# Patient Record
Sex: Female | Born: 1955 | Race: White | Hispanic: No | Marital: Married | Smoking: Never smoker
Health system: Southern US, Community
[De-identification: ages and names within clinical notes are randomized; demographics above are authoritative.]

## PROBLEM LIST (undated history)

## (undated) ENCOUNTER — Ambulatory Visit (HOSPITAL_COMMUNITY): Admission: EM | Payer: PRIVATE HEALTH INSURANCE | Source: Home / Self Care

## (undated) DIAGNOSIS — C50919 Malignant neoplasm of unspecified site of unspecified female breast: Secondary | ICD-10-CM

## (undated) DIAGNOSIS — F0390 Unspecified dementia without behavioral disturbance: Secondary | ICD-10-CM

## (undated) DIAGNOSIS — C801 Malignant (primary) neoplasm, unspecified: Secondary | ICD-10-CM

---

## 2011-03-21 ENCOUNTER — Other Ambulatory Visit: Payer: Self-pay | Admitting: Unknown Physician Specialty

## 2011-03-21 DIAGNOSIS — Z1231 Encounter for screening mammogram for malignant neoplasm of breast: Secondary | ICD-10-CM

## 2011-03-21 DIAGNOSIS — Z853 Personal history of malignant neoplasm of breast: Secondary | ICD-10-CM

## 2011-04-07 ENCOUNTER — Ambulatory Visit
Admission: RE | Admit: 2011-04-07 | Discharge: 2011-04-07 | Disposition: A | Discharge status: Home / Self Care | Payer: PRIVATE HEALTH INSURANCE | Source: Ambulatory Visit | Attending: Unknown Physician Specialty | Admitting: Unknown Physician Specialty

## 2011-04-07 DIAGNOSIS — Z853 Personal history of malignant neoplasm of breast: Secondary | ICD-10-CM

## 2011-04-07 DIAGNOSIS — Z1231 Encounter for screening mammogram for malignant neoplasm of breast: Secondary | ICD-10-CM

## 2012-03-15 ENCOUNTER — Other Ambulatory Visit: Payer: Self-pay | Admitting: Unknown Physician Specialty

## 2012-03-15 DIAGNOSIS — Z1231 Encounter for screening mammogram for malignant neoplasm of breast: Secondary | ICD-10-CM

## 2012-04-12 ENCOUNTER — Ambulatory Visit
Admission: RE | Admit: 2012-04-12 | Discharge: 2012-04-12 | Disposition: A | Discharge status: Home / Self Care | Payer: PRIVATE HEALTH INSURANCE | Source: Ambulatory Visit | Attending: Unknown Physician Specialty | Admitting: Unknown Physician Specialty

## 2012-04-12 DIAGNOSIS — Z1231 Encounter for screening mammogram for malignant neoplasm of breast: Secondary | ICD-10-CM

## 2013-03-07 ENCOUNTER — Other Ambulatory Visit: Payer: Self-pay

## 2013-03-07 DIAGNOSIS — Z1231 Encounter for screening mammogram for malignant neoplasm of breast: Secondary | ICD-10-CM

## 2013-04-18 ENCOUNTER — Ambulatory Visit
Admission: RE | Admit: 2013-04-18 | Discharge: 2013-04-18 | Disposition: A | Discharge status: Home / Self Care | Payer: PRIVATE HEALTH INSURANCE | Source: Ambulatory Visit

## 2013-04-18 DIAGNOSIS — Z1231 Encounter for screening mammogram for malignant neoplasm of breast: Secondary | ICD-10-CM

## 2014-04-04 ENCOUNTER — Other Ambulatory Visit: Payer: Self-pay

## 2014-04-04 DIAGNOSIS — Z1231 Encounter for screening mammogram for malignant neoplasm of breast: Secondary | ICD-10-CM

## 2014-04-04 DIAGNOSIS — Z853 Personal history of malignant neoplasm of breast: Secondary | ICD-10-CM

## 2014-04-26 ENCOUNTER — Ambulatory Visit
Admission: RE | Admit: 2014-04-26 | Discharge: 2014-04-26 | Disposition: A | Discharge status: Home / Self Care | Payer: PRIVATE HEALTH INSURANCE | Source: Ambulatory Visit

## 2014-04-26 DIAGNOSIS — Z853 Personal history of malignant neoplasm of breast: Secondary | ICD-10-CM

## 2014-04-26 DIAGNOSIS — Z1231 Encounter for screening mammogram for malignant neoplasm of breast: Secondary | ICD-10-CM

## 2015-03-22 ENCOUNTER — Other Ambulatory Visit: Payer: Self-pay

## 2015-03-22 DIAGNOSIS — Z1231 Encounter for screening mammogram for malignant neoplasm of breast: Secondary | ICD-10-CM

## 2015-04-30 ENCOUNTER — Ambulatory Visit: Payer: PRIVATE HEALTH INSURANCE

## 2015-06-29 ENCOUNTER — Ambulatory Visit
Admission: RE | Admit: 2015-06-29 | Discharge: 2015-06-29 | Disposition: A | Discharge status: Home / Self Care | Payer: PRIVATE HEALTH INSURANCE | Source: Ambulatory Visit

## 2015-06-29 DIAGNOSIS — Z1231 Encounter for screening mammogram for malignant neoplasm of breast: Secondary | ICD-10-CM

## 2016-01-11 ENCOUNTER — Ambulatory Visit: Payer: PRIVATE HEALTH INSURANCE | Attending: Unknown Physician Specialty

## 2016-01-11 DIAGNOSIS — R41841 Cognitive communication deficit: Secondary | ICD-10-CM

## 2016-01-11 NOTE — Patient Instructions (Signed)
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  Practice answering her iPhone, 20-25 reps, a couple to few times per day.

## 2016-01-14 NOTE — Therapy (Signed)
[facility] [address] [place], [place], [address] Phone: [phone]   Fax:  [phone]  Speech Language Pathology Evaluation  Patient Details  Name: Danielle Snyder MRN: [id number] Date of Birth: [birth year] Referring Provider: Guy Begin M.D.  Encounter Date: 01/11/2016    No past medical history on file.  No past surgical history on file.  There were no vitals filed for this visit.          SLP Evaluation [facility] - 01/14/16 1223      SLP Visit Information   SLP Received On 01/11/16   Referring Provider Guy Begin M.D.   Onset Date Late 2016   Medical Diagnosis Frontotemporal Dementia     Pain Assessment   Currently in Pain? 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   Following Commands Follows multi-step commands inconsistently;Follows one step commands inconsistently   Safety and Judgement Comments Pt reportedly has forgotten how to put her car into gear, putting away dirty dishes.   Awareness Emergent   Awareness Impaired   Awareness Impairment Emergent impairment   Problem Solving Impaired   Problem Solving Impairment Verbal basic;Functional basic   Executive Function Reasoning;Organizing;Initiating;Self Monitoring;Self Correcting     Auditory Comprehension   Overall Auditory Comprehension Appears within functional limits for tasks assessed  extra  time req'd occasionally     Expression   Primary Mode of Expression Verbal     Verbal Expression   Overall Verbal Expression Impaired  with extra time needed at times   Naming Impairment   Divergent --  two words beginning with "s" - due to decr'd cognition     Standardized Assessments   Standardized Assessments  Montreal Cognitive Assessment (MOCA)   Montreal Cognitive Assessment (MOCA)  15/30 (scribble marks for all written tasks), false positives and decr'd awareness/attention with letter ID in string. Pt's emergent awareness, attention, organization, memory skills appear deficient.                              SLP Long Term Goals - 01/14/16 1228      SLP LONG TERM GOAL #1   Title pt will demo knowledge of how to access flowsheets/directions for household tasks   Time 4   Period Weeks  or 8 visits   Status New     SLP LONG TERM GOAL #2   Title pt/husband will demo knowledge of how to develop flowsheets/directions for simple household tasks   Time 4   Period Weeks  or 8 visits   Status New        Patient will benefit from skilled therapeutic intervention in order to improve the following deficits and impairments:   Cognitive communication deficit    Problem List There are no active problems to display for this patient.   [facility] ,[place], [place]  01/14/2016, 12:37 PM  [facility] [address] [place], [place],  [id number] Phone: [phone]   Fax:  [phone]  Name: Danielle Snyder MRN: [id number] Date of Birth: 06/12/1956

## 2016-01-16 ENCOUNTER — Ambulatory Visit: Payer: PRIVATE HEALTH INSURANCE | Attending: Unknown Physician Specialty

## 2016-01-16 DIAGNOSIS — R41841 Cognitive communication deficit: Secondary | ICD-10-CM | POA: Diagnosis not present

## 2016-01-16 NOTE — Patient Instructions (Signed)
Let me know how the coffee making goes next session, as well as the dryer and the simple meal preparation, using written directions. We can continue to problem solve next time, but you have a very good idea of how to do this at home now.

## 2016-01-16 NOTE — Therapy (Signed)
[facility] [address] [place], [place], [address] Phone: [phone]   Fax:  [phone]  Speech Language Pathology Treatment  Patient Details  Name: Danielle Snyder MRN: [id number] Date of Birth: [birth year] Referring Provider: Guy Begin M.D.  Encounter Date: 01/16/2016      End of Session - 01/16/16 1629    Visit Number 2   Number of Visits 17   Date for SLP Re-Evaluation 04/12/16   Authorization - Visit Number 5   Authorization - Number of Visits 30   SLP Start Time [id number]   SLP Stop Time  [id number]   SLP Time Calculation (min) 43 min   Activity Tolerance Patient tolerated treatment well      No past medical history on file.  No past surgical history on file.  There were no vitals filed for this visit.             ADULT SLP TREATMENT - 01/16/16 1437      General Information   Behavior/Cognition Confused;Cooperative;Pleasant mood;Distractible;Requires cueing     Treatment Provided   Treatment provided Cognitive-Linquistic     Pain Assessment   Pain Assessment No/denies pain     Cognitive-Linquistic Treatment   Treatment focused on Cognition;Aphasia   Skilled Treatment SLP assisted pt and husband with organizing written instructions for pt to operate coffee maker, and provided problem solving and consulting re: how it might work at home, given pt's deficits in language, receptively and expressively. SLP facilitated practice with answering pt's iPhone as well, however pt independent with this today in session! Pt's husband thanked SLP three times for his assistance with these tasks, and inquired to SLP re: two other tasks (using dryer and meal prep) how to make these easier/possible for pt.     Assessment / Recommendations / Plan   Plan Continue with current plan of care  one more visit likely until d/c     Progression Toward Goals   Progression toward goals Progressing toward goals          SLP Education - 01/16/16 1629    Education Details how to "scaffold" household tasks (simple)   Person(s) Educated Patient;Spouse   Methods Explanation;Demonstration   Comprehension Verbalized understanding  (husband)            SLP Long Term Goals - 01/16/16 1632      SLP LONG TERM GOAL #1   Title pt will demo knowledge of how to access flowsheets/directions for household tasks   Time 4   Period Weeks  or 8 visits   Status New     SLP LONG TERM GOAL #2   Title pt/husband will demo knowledge of how to develop flowsheets/directions for simple household tasks   Period --  or 8 visits   Status Achieved          Plan - 01/16/16 1630    Clinical Impression Statement Pt's husband appears to have a good idea about how to simplify simple household tasks, providing written scaffolding for pt in order to cue her how to complete task. They will arrive next session with questions about how best to proceed with things theywill try these next two weeks. One-two more sessions necessary to maximize SLP A to pt to enhance her understanding of how to complete simple household tasks.   Speech Therapy Frequency 2x / week   Duration 4 weeks  or 8 visits   Treatment/Interventions Compensatory strategies;Patient/family education;Multimodal communcation approach;Internal/external aids;Language facilitation;SLP instruction and  feedback   Potential to Achieve Goals Fair   Potential Considerations Medical prognosis;Severity of impairments;Ability to learn/carryover information      Patient will benefit from skilled therapeutic intervention in order to improve the following deficits and impairments:   Cognitive communication deficit    Problem List There are no active problems to display for this patient.   [facility] ,[place], [place]  01/16/2016, 4:32 PM  [facility] [address] [place], [place], [address] Phone: [phone]    Fax:  [phone]   Name: Danielle Snyder MRN: [id number] Date of Birth: June 27, 1955

## 2016-01-28 ENCOUNTER — Ambulatory Visit: Payer: PRIVATE HEALTH INSURANCE

## 2016-02-19 ENCOUNTER — Ambulatory Visit: Payer: PRIVATE HEALTH INSURANCE

## 2016-02-20 NOTE — Therapy (Signed)
[facility] [address] [place], [place], [address] Phone: [phone]   Fax:  [phone]  Patient Details  Name: Danielle Snyder MRN: [id number] Date of Birth: Jan 04, 1956 Referring Provider:  No ref. provider found  Encounter Date: 02/20/2016  SPEECH THERAPY DISCHARGE SUMMARY  Visits from Start of Care: 2  Current functional level related to goals / functional outcomes: Pt only had one session of ST prior to request for putting on hold due to second opinion. In that one session, session note and goal summary including plan/clinical impression statement are below:  SLP assisted pt and husband with organizing written instructions for pt to operate coffee maker, and provided problem solving and consulting re: how it might work at home, given pt's deficits in language, receptively and expressively. SLP facilitated practice with answering pt's iPhone as well, however pt independent with this today in session! Pt's husband thanked SLP three times for his assistance with these tasks, and inquired to SLP re: two other tasks (using dryer and meal prep) how to make these easier/possible for pt.   SLP Long Term Goals - 01/16/16 1632              SLP LONG TERM GOAL #1    Title pt will demo knowledge of how to access flowsheets/directions for household tasks    Time 4    Period Weeks  or 8 visits    Status New         SLP LONG TERM GOAL #2    Title pt/husband will demo knowledge of how to develop flowsheets/directions for simple household tasks    Period --  or 8 visits    Status Achieved                       Plan - 01/16/16 1630     Clinical Impression Statement Pt's husband appears to have a good idea about how to simplify simple household tasks, providing written scaffolding for pt in order to cue her how to complete task. They will arrive next session with questions about how best to proceed with things theywill try these  next two weeks. One-two more sessions necessary to maximize SLP A to pt to enhance her understanding of how to complete simple household tasks.      Remaining deficits: Significant cognitive-linguistic deficits stemming from what appears most like FTD.   Education / Equipment: Compensations for [employer] and cognitive deficits.  Plan: Patient agrees to discharge.  Patient goals were not met. Patient is being discharged due to                                                     ?????pt/family request         Garald Balding ,[place], [place]  02/20/2016, 11:14 AM  [facility] [address] [place], [place], [address] Phone: [phone]   Fax:  [phone]

## 2016-02-21 ENCOUNTER — Ambulatory Visit: Payer: PRIVATE HEALTH INSURANCE

## 2016-11-29 ENCOUNTER — Encounter (HOSPITAL_COMMUNITY): Payer: Self-pay | Admitting: Emergency Medicine

## 2016-11-29 ENCOUNTER — Emergency Department (HOSPITAL_COMMUNITY): Payer: PRIVATE HEALTH INSURANCE

## 2016-11-29 ENCOUNTER — Emergency Department (HOSPITAL_COMMUNITY)
Admission: EM | Admit: 2016-11-29 | Discharge: 2016-11-29 | Disposition: A | Discharge status: Home / Self Care | Payer: PRIVATE HEALTH INSURANCE | Attending: Emergency Medicine | Admitting: Emergency Medicine

## 2016-11-29 DIAGNOSIS — Y939 Activity, unspecified: Secondary | ICD-10-CM | POA: Insufficient documentation

## 2016-11-29 DIAGNOSIS — F0390 Unspecified dementia without behavioral disturbance: Secondary | ICD-10-CM | POA: Diagnosis not present

## 2016-11-29 DIAGNOSIS — Y92015 Private garage of single-family (private) house as the place of occurrence of the external cause: Secondary | ICD-10-CM | POA: Insufficient documentation

## 2016-11-29 DIAGNOSIS — Z79899 Other long term (current) drug therapy: Secondary | ICD-10-CM | POA: Insufficient documentation

## 2016-11-29 DIAGNOSIS — S0993XA Unspecified injury of face, initial encounter: Secondary | ICD-10-CM | POA: Diagnosis present

## 2016-11-29 DIAGNOSIS — W19XXXA Unspecified fall, initial encounter: Secondary | ICD-10-CM | POA: Insufficient documentation

## 2016-11-29 DIAGNOSIS — Y998 Other external cause status: Secondary | ICD-10-CM | POA: Insufficient documentation

## 2016-11-29 HISTORY — DX: Unspecified dementia, unspecified severity, without behavioral disturbance, psychotic disturbance, mood disturbance, and anxiety: F03.90

## 2016-11-29 NOTE — ED Provider Notes (Signed)
[facility] DEPT Provider Note   CSN: [id number] Arrival date & time: 11/29/16  1104     History   Chief Complaint Chief Complaint  Patient presents with  . Fall  . Facial Pain    HPI Tomeko Scoville is a 61 y.o. female.  The history is provided by the patient and medical records.  Fall     Level V caveat: Dementia 61 year old female with history of dementia brought in by her husband after a fall last evening. Husband reports patient tends to get up and pace during the night. States there is a time on all of their doors so he did hear the door to the garage open and then heard a loud noise.  States he did not hear her moving after nurse that he got up to check on her and found her face down on the garage floor. She was awake and moving but was "out of it".  He states she laid on the floor for Mabel was able to get up and walk back to her room. States she thinks it often turn during the night and did not sleep very much in his opinion. States this morning when he got up he noticed a lot of bruising around her left eye along left side of her jaw. Husband reports their daughter is a physician in [place] so they face timed this morning and daughter recommended to come in for head CT.  Husband reports she does seem mildly "sluggish" compared to her normal, he thinks this is because she did not sleep very much. She is not currently on anticoagulation. She has no history of significant head injuries in the past.  Past Medical History:  Diagnosis Date  . Dementia     There are no active problems to display for this patient.   History reviewed. No pertinent surgical history.  OB History    No data available       Home Medications    Prior to Admission medications   Medication Sig Start Date End Date Taking? Authorizing Provider  sertraline (ZOLOFT) 50 MG tablet Take 25-50 mg by mouth See admin instructions. Take 50 mg by mouth in the morning and take 25 mg by mouth in the evening  11/04/16  Yes [provider]    Family History No family history on file.  Social History Social History  Substance Use Topics  . Smoking status: Not on file  . Smokeless tobacco: Not on file  . Alcohol use Not on file     Allergies   Sulfa antibiotics and Clarithromycin   Review of Systems Review of Systems  Unable to perform ROS: Dementia     Physical Exam Updated Vital Signs BP 108/85 (BP Location: Right Arm)   Pulse 67   Temp 98.3 F (36.8 C) (Oral)   Resp 18   Ht 5\' 7"  (1.702 m)   Wt 63.5 kg (140 lb)   SpO2 98%   BMI 21.93 kg/m   Physical Exam  Constitutional: She appears well-developed and well-nourished.  HENT:  Head: Normocephalic and atraumatic.  Mouth/Throat: Oropharynx is clear and moist.  Bruising beneath left eye as well as along the left side of the jaw, I do not appreciate any bony deformities or crepitus, patient unwilling to open mouth widely; no open wounds or lacerations  Eyes: Conjunctivae and EOM are normal. Pupils are equal, round, and reactive to light.  Neck: Normal range of motion.  Cardiovascular: Normal rate, regular rhythm and normal heart  sounds.   Pulmonary/Chest: Effort normal and breath sounds normal. No respiratory distress. She has no wheezes.  Abdominal: Soft. Bowel sounds are normal. There is no tenderness. There is no rebound.  Musculoskeletal: Normal range of motion.  There is some mild superficial bruising and swelling of the left knee, worse along the lateral joint line, there is no crepitus or bony deformity, no apparent pain with range of motion  Neurological: She is alert.  Awake, alert, follows some commands when prompted, does not provide any verbal responses for questioning, spontaneously moving her arms and legs without apparent issue  Skin: Skin is warm and dry.  Psychiatric: She has a normal mood and affect.  Nursing note and vitals reviewed.    ED Treatments / Results  Labs (all labs ordered are  listed, but only abnormal results are displayed) Labs Reviewed - No data to display  EKG  EKG Interpretation None       Radiology Ct Head Wo Contrast  Result Date: 11/29/2016 CLINICAL DATA:  Dementia.  Recent fall.  Left face swelling. EXAM: CT HEAD WITHOUT CONTRAST CT MAXILLOFACIAL WITHOUT CONTRAST CT CERVICAL SPINE WITHOUT CONTRAST TECHNIQUE: Multidetector CT imaging of the head, cervical spine, and maxillofacial structures were performed using the standard protocol without intravenous contrast. Multiplanar CT image reconstructions of the cervical spine and maxillofacial structures were also generated. 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 Finding is unchanged. Low-density in periventricular white matter appears stable. Negative for acute hemorrhage, mass lesion, midline shift, hydrocephalus or large infarct. Mild cerebral atrophy is unchanged. Vascular: No hyperdense vessel or unexpected calcification. Skull: Normal. Negative for fracture or focal lesion. Other: None. CT MAXILLOFACIAL FINDINGS Osseous: No fracture or mandibular dislocation. No destructive process. Orbits: Negative. No traumatic or inflammatory finding. Sinuses: Small polyp or retention cysts in the right maxillary sinus. Small amount of fluid or mucosal disease in the left sphenoid sinus. Mild disease in left ethmoid air cells. Mastoid air cells are clear. Normal appearance of the middle ear ossicles. Soft tissues: Soft tissue swelling in the left cheek and left side of the face. CT CERVICAL SPINE FINDINGS Alignment: Normal. Skull base and vertebrae: No acute fracture. No primary bone lesion or focal pathologic process. Soft tissues and spinal canal: Swelling along the left lower face. No prevertebral fluid or swelling. No visible canal hematoma. Disc levels:  Disc space narrowing at C3-C4. Upper chest: Scarring at  the lung apices. Negative for a large pneumothorax. Other: None IMPRESSION: No acute intracranial abnormality. Cerebral atrophy and evidence for chronic small vessel ischemic changes. Soft tissue swelling along the left side of the face. No facial bone fracture. Mild paranasal sinus disease. No acute bone abnormality in the cervical spine. Electronically Signed   By: Markus Daft M.D.   On: 11/29/2016 14:07   Ct Cervical Spine Wo Contrast  Result Date: 11/29/2016 CLINICAL DATA:  Dementia.  Recent fall.  Left face swelling. EXAM: CT HEAD WITHOUT CONTRAST CT MAXILLOFACIAL WITHOUT CONTRAST CT CERVICAL SPINE WITHOUT CONTRAST TECHNIQUE: Multidetector CT imaging of the head, cervical spine, and maxillofacial structures were performed using the standard protocol without intravenous contrast. Multiplanar CT image reconstructions of the cervical spine and maxillofacial structures were also generated. 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 Finding is unchanged. Low-density in periventricular white matter appears stable.  Negative for acute hemorrhage, mass lesion, midline shift, hydrocephalus or large infarct. Mild cerebral atrophy is unchanged. Vascular: No hyperdense vessel or unexpected calcification. Skull: Normal. Negative for fracture or focal lesion. Other: None. CT MAXILLOFACIAL FINDINGS Osseous: No fracture or mandibular dislocation. No destructive process. Orbits: Negative. No traumatic or inflammatory finding. Sinuses: Small polyp or retention cysts in the right maxillary sinus. Small amount of fluid or mucosal disease in the left sphenoid sinus. Mild disease in left ethmoid air cells. Mastoid air cells are clear. Normal appearance of the middle ear ossicles. Soft tissues: Soft tissue swelling in the left cheek and left side of the face. CT CERVICAL SPINE FINDINGS Alignment: Normal. Skull base  and vertebrae: No acute fracture. No primary bone lesion or focal pathologic process. Soft tissues and spinal canal: Swelling along the left lower face. No prevertebral fluid or swelling. No visible canal hematoma. Disc levels:  Disc space narrowing at C3-C4. Upper chest: Scarring at the lung apices. Negative for a large pneumothorax. Other: None IMPRESSION: No acute intracranial abnormality. Cerebral atrophy and evidence for chronic small vessel ischemic changes. Soft tissue swelling along the left side of the face. No facial bone fracture. Mild paranasal sinus disease. No acute bone abnormality in the cervical spine. Electronically Signed   By: Markus Daft M.D.   On: 11/29/2016 14:07   Ct Maxillofacial Wo Contrast  Result Date: 11/29/2016 CLINICAL DATA:  Dementia.  Recent fall.  Left face swelling. EXAM: CT HEAD WITHOUT CONTRAST CT MAXILLOFACIAL WITHOUT CONTRAST CT CERVICAL SPINE WITHOUT CONTRAST TECHNIQUE: Multidetector CT imaging of the head, cervical spine, and maxillofacial structures were performed using the standard protocol without intravenous contrast. Multiplanar CT image reconstructions of the cervical spine and maxillofacial structures were also generated. 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 Finding is unchanged. Low-density in periventricular white matter appears stable. Negative for acute hemorrhage, mass lesion, midline shift, hydrocephalus or large infarct. Mild cerebral atrophy is unchanged. Vascular: No hyperdense vessel or unexpected calcification. Skull: Normal. Negative for fracture or focal lesion. Other: None. CT MAXILLOFACIAL FINDINGS Osseous: No fracture or mandibular dislocation. No destructive process. Orbits: Negative. No traumatic or inflammatory finding. Sinuses: Small polyp or retention cysts in the right maxillary sinus. Small amount of fluid or mucosal  disease in the left sphenoid sinus. Mild disease in left ethmoid air cells. Mastoid air cells are clear. Normal appearance of the middle ear ossicles. Soft tissues: Soft tissue swelling in the left cheek and left side of the face. CT CERVICAL SPINE FINDINGS Alignment: Normal. Skull base and vertebrae: No acute fracture. No primary bone lesion or focal pathologic process. Soft tissues and spinal canal: Swelling along the left lower face. No prevertebral fluid or swelling. No visible canal hematoma. Disc levels:  Disc space narrowing at C3-C4. Upper chest: Scarring at the lung apices. Negative for a large pneumothorax. Other: None IMPRESSION: No acute intracranial abnormality. Cerebral atrophy and evidence for chronic small vessel ischemic changes. Soft tissue swelling along the left side of the face. No facial bone fracture. Mild paranasal sinus disease. No acute bone abnormality in the cervical spine. Electronically Signed   By: Markus Daft M.D.   On: 11/29/2016 14:07    Procedures Procedures (including critical care time)  Medications Ordered in ED Medications - No data to display   Initial Impression / Assessment and Plan / ED Course  I have reviewed the triage vital  signs and the nursing notes.  Pertinent labs & imaging results that were available during my care of the patient were reviewed by me and considered in my medical decision making (see chart for details).  61 year old female here after fall last evening. She has known dementia.  On exam she has bruising surrounding the left eye along left side of her jaw but I do not appreciate any bony deformities. She appears close to neurologic baseline per husband, mildly more sluggish than normal.  CT head, face, and neck were obtained without any acute findings. Patient has remained stable here. I recommended to obtain plain films of the left knee, however husband states he feels like it is okay and wants to take her home. Feel this is reasonable as  her vitals remain stable and she has unchanged neuro exam from prior. Discussed with him that she may have a mild concussion which may be contributing to some mild cognitive changes/sluggishness.  Recommend he monitor her closely over the next 24-48 hours. Follow-up with PCP.  Discussed plan with patient's husband, he acknowledged understanding and agreed with plan of care.  Return precautions given for new or worsening symptoms.  Final Clinical Impressions(s) / ED Diagnoses   Final diagnoses:  Fall, initial encounter  Facial trauma, initial encounter    New Prescriptions New Prescriptions   No medications on file     Larene Pickett, PA-C 11/29/16 1508    Charlesetta Shanks, MD 11/30/16 1615

## 2016-11-29 NOTE — ED Triage Notes (Signed)
Per husband, pt fell yesterday, tripped and fell down three steps down into the garage, cement. This morning pt started having black eye and swelling on the left side of the face. Unsure of LOC, husband stated she was drowsy. Pt hx of dementia. Pt not on blood thinners. Per husband, pt seems "off". Pt repeating the word "okay" over and over.

## 2016-11-29 NOTE — Discharge Instructions (Signed)
As we discussed, CT scans of the head/face/neck were all normal.  May be a mild concussion which can cause some mild cognitive changes, drowsiness, etc.  I would monitor her over the next 24-48 hours.   Follow-up with your primary care doctor if any ongoing issues. Return to the ED for new or worsening symptoms.

## 2016-11-30 ENCOUNTER — Emergency Department (HOSPITAL_COMMUNITY)
Admission: EM | Admit: 2016-11-30 | Discharge: 2016-11-30 | Disposition: A | Discharge status: Home / Self Care | Payer: PRIVATE HEALTH INSURANCE | Attending: Emergency Medicine | Admitting: Emergency Medicine

## 2016-11-30 ENCOUNTER — Emergency Department (HOSPITAL_COMMUNITY): Payer: PRIVATE HEALTH INSURANCE

## 2016-11-30 ENCOUNTER — Encounter (HOSPITAL_COMMUNITY): Payer: Self-pay

## 2016-11-30 DIAGNOSIS — W1839XA Other fall on same level, initial encounter: Secondary | ICD-10-CM | POA: Insufficient documentation

## 2016-11-30 DIAGNOSIS — Y929 Unspecified place or not applicable: Secondary | ICD-10-CM | POA: Insufficient documentation

## 2016-11-30 DIAGNOSIS — Y939 Activity, unspecified: Secondary | ICD-10-CM | POA: Diagnosis not present

## 2016-11-30 DIAGNOSIS — S0083XA Contusion of other part of head, initial encounter: Secondary | ICD-10-CM | POA: Insufficient documentation

## 2016-11-30 DIAGNOSIS — Y999 Unspecified external cause status: Secondary | ICD-10-CM | POA: Insufficient documentation

## 2016-11-30 DIAGNOSIS — R1110 Vomiting, unspecified: Secondary | ICD-10-CM | POA: Diagnosis not present

## 2016-11-30 DIAGNOSIS — F0781 Postconcussional syndrome: Secondary | ICD-10-CM | POA: Diagnosis not present

## 2016-11-30 DIAGNOSIS — S0993XA Unspecified injury of face, initial encounter: Secondary | ICD-10-CM | POA: Diagnosis present

## 2016-11-30 LAB — COMPREHENSIVE METABOLIC PANEL
ALK PHOS: 52 U/L (ref 38–126)
ALT: 33 U/L (ref 14–54)
AST: 39 U/L (ref 15–41)
Albumin: 4 g/dL (ref 3.5–5.0)
Anion gap: 9 (ref 5–15)
BILIRUBIN TOTAL: 0.6 mg/dL (ref 0.3–1.2)
BUN: 17 mg/dL (ref 6–20)
CALCIUM: 9.5 mg/dL (ref 8.9–10.3)
CO2: 23 mmol/L (ref 22–32)
Chloride: 105 mmol/L (ref 101–111)
Creatinine, Ser: 0.84 mg/dL (ref 0.44–1.00)
GFR calc Af Amer: >60 mL/min (ref >60)
GFR calc non Af Amer: >60 mL/min (ref >60)
Glucose, Bld: 148 mg/dL — ABNORMAL HIGH (ref 65–99)
Potassium: 3.8 mmol/L (ref 3.5–5.1)
Sodium: 137 mmol/L (ref 135–145)
Total Protein: 7 g/dL (ref 6.5–8.1)

## 2016-11-30 LAB — CBC
HEMATOCRIT: 42.3 % (ref 36.0–46.0)
HEMOGLOBIN: 14.3 g/dL (ref 12.0–15.0)
MCH: 31.4 pg (ref 26.0–34.0)
MCHC: 33.8 g/dL (ref 30.0–36.0)
MCV: 93 fL (ref 78.0–100.0)
Platelets: 142 10*3/uL — ABNORMAL LOW (ref 150–400)
RBC: 4.55 MIL/uL (ref 3.87–5.11)
RDW: 13 % (ref 11.5–15.5)
WBC: 10.9 10*3/uL — AB (ref 4.0–10.5)

## 2016-11-30 MED ORDER — ONDANSETRON 4 MG PO TBDP
8.0000 mg | ORAL_TABLET | Freq: Once | ORAL | Status: AC
  Administered 2016-11-30: 8 mg via ORAL
  Filled 2016-11-30: qty 2

## 2016-11-30 MED ORDER — ONDANSETRON 8 MG PO TBDP: 8.0000 mg | ORAL_TABLET | Freq: Three times a day (TID) | ORAL | 0 refills | Status: DC | PRN

## 2016-11-30 NOTE — ED Provider Notes (Signed)
[facility] DEPT Provider Note   CSN: [id number] Arrival date & time: 11/30/16  1904     History   Chief Complaint Chief Complaint  Patient presents with  . Emesis    HPI Danielle Snyder is a 61 y.o. female.  Patient with hx advanced dementia, with several episodes vomiting today.  Ppt s/p fall yesterday, with contusion to head/face. Ct then negative. Today with 3-4 episodes vomiting. Emesis color of recently ingested food/liquid - no bloody or bilious emesis. Did have a couple diarrhea stools, but spouse indicates that is not unusual for patient. Patient w advanced dementia - unable to give additional hx herself - level 5 caveat.  No fevers.    The history is provided by the patient and the spouse. The history is limited by the condition of the patient.  Emesis      Past Medical History:  Diagnosis Date  . Dementia     There are no active problems to display for this patient.   History reviewed. No pertinent surgical history.  OB History    No data available       Home Medications    Prior to Admission medications   Medication Sig Start Date End Date Taking? Authorizing Provider  sertraline (ZOLOFT) 50 MG tablet Take 25-50 mg by mouth See admin instructions. Take 50 mg by mouth in the morning and take 25 mg by mouth in the evening 11/04/16   [provider]    Family History History reviewed. No pertinent family history.  Social History Social History  Substance Use Topics  . Smoking status: Never Smoker  . Smokeless tobacco: Never Used  . Alcohol use Not on file     Allergies   Sulfa antibiotics and Clarithromycin   Review of Systems Review of Systems  Unable to perform ROS: Dementia  Gastrointestinal: Positive for vomiting.  level 5 caveat   Physical Exam Updated Vital Signs BP 118/67 (BP Location: Right Arm)   Pulse 64   Resp 16   SpO2 96%   Physical Exam  Constitutional: She appears well-developed and well-nourished. No  distress.  HENT:  Right Ear: External ear normal.  Left Ear: External ear normal.  Nose: Nose normal.  Mouth/Throat: Oropharynx is clear and moist.  Contusion and bruising to left face and about left eye. tms normal.   Eyes: Conjunctivae are normal. Pupils are equal, round, and reactive to light. No scleral icterus.  Neck: Normal range of motion. Neck supple. No tracheal deviation present.  Cardiovascular: Normal rate, regular rhythm, normal heart sounds and intact distal pulses.  Exam reveals no gallop and no friction rub.   No murmur heard. Pulmonary/Chest: Effort normal and breath sounds normal. No respiratory distress. She exhibits no tenderness.  Abdominal: Soft. Normal appearance and bowel sounds are normal. She exhibits no distension and no mass. There is no tenderness. There is no rebound and no guarding. No hernia.  Genitourinary:  Genitourinary Comments: No cva tenderness  Musculoskeletal: She exhibits no edema.  CTLS spine, non tender, aligned, no step off. No focal bony tenderness on extremity exam.   Neurological: She is alert.  Skin: Skin is warm and dry. No rash noted. She is not diaphoretic.  Psychiatric: She has a normal mood and affect.  Nursing note and vitals reviewed.    ED Treatments / Results  Labs (all labs ordered are listed, but only abnormal results are displayed) Results for orders placed or performed during the hospital encounter of 11/30/16  CBC  Result Value Ref Range   WBC 10.9 (H) 4.0 - 10.5 K/uL   RBC 4.55 3.87 - 5.11 MIL/uL   Hemoglobin 14.3 12.0 - 15.0 g/dL   HCT 42.3 36.0 - 46.0 %   MCV 93.0 78.0 - 100.0 fL   MCH 31.4 26.0 - 34.0 pg   MCHC 33.8 30.0 - 36.0 g/dL   RDW 13.0 11.5 - 15.5 %   Platelets 142 (L) 150 - 400 K/uL   Ct Head Wo Contrast  Result Date: 11/30/2016 CLINICAL DATA:  61 year old female with fall 3 nights ago and persistent vomiting. EXAM: CT HEAD WITHOUT CONTRAST TECHNIQUE: Contiguous axial images were obtained from the  base of the skull through the vertex without intravenous contrast. COMPARISON:  Head CT dated 11/29/2016 FINDINGS: Brain: There is moderate age-related atrophy and chronic microvascular ischemic changes. There is no acute intracranial hemorrhage. No mass effect or midline shift noted. No intra-axial fluid collection. Vascular: No hyperdense vessel or unexpected calcification. Skull: Normal. Negative for fracture or focal lesion. Sinuses/Orbits: Mild mucoperiosteal thickening of paranasal sinuses. No air-fluid levels. The mastoid air cells are clear. Cerumen noted in the right external auditory canal. Other: None IMPRESSION: 1. No acute intracranial hemorrhage. 2. Moderate age-related atrophy and chronic microvascular ischemic changes. Electronically Signed   By: Anner Crete M.D.   On: 11/30/2016 21:11   Ct Head Wo Contrast  Result Date: 11/29/2016 CLINICAL DATA:  Dementia.  Recent fall.  Left face swelling. EXAM: CT HEAD WITHOUT CONTRAST CT MAXILLOFACIAL WITHOUT CONTRAST CT CERVICAL SPINE WITHOUT CONTRAST TECHNIQUE: Multidetector CT imaging of the head, cervical spine, and maxillofacial structures were performed using the standard protocol without intravenous contrast. Multiplanar CT image reconstructions of the cervical spine and maxillofacial structures were also generated. 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 Finding is unchanged. Low-density in periventricular white matter appears stable. Negative for acute hemorrhage, mass lesion, midline shift, hydrocephalus or large infarct. Mild cerebral atrophy is unchanged. Vascular: No hyperdense vessel or unexpected calcification. Skull: Normal. Negative for fracture or focal lesion. Other: None. CT MAXILLOFACIAL FINDINGS Osseous: No fracture or mandibular dislocation. No destructive process. Orbits: Negative. No traumatic or inflammatory  finding. Sinuses: Small polyp or retention cysts in the right maxillary sinus. Small amount of fluid or mucosal disease in the left sphenoid sinus. Mild disease in left ethmoid air cells. Mastoid air cells are clear. Normal appearance of the middle ear ossicles. Soft tissues: Soft tissue swelling in the left cheek and left side of the face. CT CERVICAL SPINE FINDINGS Alignment: Normal. Skull base and vertebrae: No acute fracture. No primary bone lesion or focal pathologic process. Soft tissues and spinal canal: Swelling along the left lower face. No prevertebral fluid or swelling. No visible canal hematoma. Disc levels:  Disc space narrowing at C3-C4. Upper chest: Scarring at the lung apices. Negative for a large pneumothorax. Other: None IMPRESSION: No acute intracranial abnormality. Cerebral atrophy and evidence for chronic small vessel ischemic changes. Soft tissue swelling along the left side of the face. No facial bone fracture. Mild paranasal sinus disease. No acute bone abnormality in the cervical spine. Electronically Signed   By: Markus Daft M.D.   On: 11/29/2016 14:07   Ct Cervical Spine Wo Contrast  Result Date: 11/29/2016 CLINICAL DATA:  Dementia.  Recent fall.  Left face swelling. EXAM: CT HEAD WITHOUT CONTRAST CT MAXILLOFACIAL WITHOUT CONTRAST CT CERVICAL SPINE WITHOUT CONTRAST TECHNIQUE:  Multidetector CT imaging of the head, cervical spine, and maxillofacial structures were performed using the standard protocol without intravenous contrast. Multiplanar CT image reconstructions of the cervical spine and maxillofacial structures were also generated. 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 Finding is unchanged. Low-density in periventricular white matter appears stable. Negative for acute hemorrhage, mass lesion, midline shift, hydrocephalus or large infarct. Mild cerebral atrophy is  unchanged. Vascular: No hyperdense vessel or unexpected calcification. Skull: Normal. Negative for fracture or focal lesion. Other: None. CT MAXILLOFACIAL FINDINGS Osseous: No fracture or mandibular dislocation. No destructive process. Orbits: Negative. No traumatic or inflammatory finding. Sinuses: Small polyp or retention cysts in the right maxillary sinus. Small amount of fluid or mucosal disease in the left sphenoid sinus. Mild disease in left ethmoid air cells. Mastoid air cells are clear. Normal appearance of the middle ear ossicles. Soft tissues: Soft tissue swelling in the left cheek and left side of the face. CT CERVICAL SPINE FINDINGS Alignment: Normal. Skull base and vertebrae: No acute fracture. No primary bone lesion or focal pathologic process. Soft tissues and spinal canal: Swelling along the left lower face. No prevertebral fluid or swelling. No visible canal hematoma. Disc levels:  Disc space narrowing at C3-C4. Upper chest: Scarring at the lung apices. Negative for a large pneumothorax. Other: None IMPRESSION: No acute intracranial abnormality. Cerebral atrophy and evidence for chronic small vessel ischemic changes. Soft tissue swelling along the left side of the face. No facial bone fracture. Mild paranasal sinus disease. No acute bone abnormality in the cervical spine. Electronically Signed   By: Markus Daft M.D.   On: 11/29/2016 14:07   Ct Maxillofacial Wo Contrast  Result Date: 11/29/2016 CLINICAL DATA:  Dementia.  Recent fall.  Left face swelling. EXAM: CT HEAD WITHOUT CONTRAST CT MAXILLOFACIAL WITHOUT CONTRAST CT CERVICAL SPINE WITHOUT CONTRAST TECHNIQUE: Multidetector CT imaging of the head, cervical spine, and maxillofacial structures were performed using the standard protocol without intravenous contrast. Multiplanar CT image reconstructions of the cervical spine and maxillofacial structures were also generated. COMPARISON:  Brain MRI 06/26/2015 FINDINGS: CT HEAD FINDINGS Brain: Chronic  deformity of the anterior right frontal horn may represent synechiae in this area. Finding is unchanged. Low-density in periventricular white matter appears stable. Negative for acute hemorrhage, mass lesion, midline shift, hydrocephalus or large infarct. Mild cerebral atrophy is unchanged. Vascular: No hyperdense vessel or unexpected calcification. Skull: Normal. Negative for fracture or focal lesion. Other: None. CT MAXILLOFACIAL FINDINGS Osseous: No fracture or mandibular dislocation. No destructive process. Orbits: Negative. No traumatic or inflammatory finding. Sinuses: Small polyp or retention cysts in the right maxillary sinus. Small amount of fluid or mucosal disease in the left sphenoid sinus. Mild disease in left ethmoid air cells. Mastoid air cells are clear. Normal appearance of the middle ear ossicles. Soft tissues: Soft tissue swelling in the left cheek and left side of the face. CT CERVICAL SPINE FINDINGS Alignment: Normal. Skull base and vertebrae: No acute fracture. No primary bone lesion or focal pathologic process. Soft tissues and spinal canal: Swelling along the left lower face. No prevertebral fluid or swelling. No visible canal hematoma. Disc levels:  Disc space narrowing at C3-C4. Upper chest: Scarring at the lung apices. Negative for a large pneumothorax. Other: None IMPRESSION: No acute intracranial abnormality. Cerebral atrophy and evidence for chronic small vessel ischemic changes. Soft tissue swelling along the left side of the face.  No facial bone fracture. Mild paranasal sinus disease. No acute bone abnormality in the cervical spine. Electronically Signed   By: Markus Daft M.D.   On: 11/29/2016 14:07    EKG  EKG Interpretation None       Radiology Ct Head Wo Contrast  Result Date: 11/29/2016 CLINICAL DATA:  Dementia.  Recent fall.  Left face swelling. EXAM: CT HEAD WITHOUT CONTRAST CT MAXILLOFACIAL WITHOUT CONTRAST CT CERVICAL SPINE WITHOUT CONTRAST TECHNIQUE: Multidetector  CT imaging of the head, cervical spine, and maxillofacial structures were performed using the standard protocol without intravenous contrast. Multiplanar CT image reconstructions of the cervical spine and maxillofacial structures were also generated. 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 Finding is unchanged. Low-density in periventricular white matter appears stable. Negative for acute hemorrhage, mass lesion, midline shift, hydrocephalus or large infarct. Mild cerebral atrophy is unchanged. Vascular: No hyperdense vessel or unexpected calcification. Skull: Normal. Negative for fracture or focal lesion. Other: None. CT MAXILLOFACIAL FINDINGS Osseous: No fracture or mandibular dislocation. No destructive process. Orbits: Negative. No traumatic or inflammatory finding. Sinuses: Small polyp or retention cysts in the right maxillary sinus. Small amount of fluid or mucosal disease in the left sphenoid sinus. Mild disease in left ethmoid air cells. Mastoid air cells are clear. Normal appearance of the middle ear ossicles. Soft tissues: Soft tissue swelling in the left cheek and left side of the face. CT CERVICAL SPINE FINDINGS Alignment: Normal. Skull base and vertebrae: No acute fracture. No primary bone lesion or focal pathologic process. Soft tissues and spinal canal: Swelling along the left lower face. No prevertebral fluid or swelling. No visible canal hematoma. Disc levels:  Disc space narrowing at C3-C4. Upper chest: Scarring at the lung apices. Negative for a large pneumothorax. Other: None IMPRESSION: No acute intracranial abnormality. Cerebral atrophy and evidence for chronic small vessel ischemic changes. Soft tissue swelling along the left side of the face. No facial bone fracture. Mild paranasal sinus disease. No acute bone abnormality in the cervical spine. Electronically Signed   By:  Markus Daft M.D.   On: 11/29/2016 14:07   Ct Cervical Spine Wo Contrast  Result Date: 11/29/2016 CLINICAL DATA:  Dementia.  Recent fall.  Left face swelling. EXAM: CT HEAD WITHOUT CONTRAST CT MAXILLOFACIAL WITHOUT CONTRAST CT CERVICAL SPINE WITHOUT CONTRAST TECHNIQUE: Multidetector CT imaging of the head, cervical spine, and maxillofacial structures were performed using the standard protocol without intravenous contrast. Multiplanar CT image reconstructions of the cervical spine and maxillofacial structures were also generated. 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 Finding is unchanged. Low-density in periventricular white matter appears stable. Negative for acute hemorrhage, mass lesion, midline shift, hydrocephalus or large infarct. Mild cerebral atrophy is unchanged. Vascular: No hyperdense vessel or unexpected calcification. Skull: Normal. Negative for fracture or focal lesion. Other: None. CT MAXILLOFACIAL FINDINGS Osseous: No fracture or mandibular dislocation. No destructive process. Orbits: Negative. No traumatic or inflammatory finding. Sinuses: Small polyp or retention cysts in the right maxillary sinus. Small amount of fluid or mucosal disease in the left sphenoid sinus. Mild disease in left ethmoid air cells. Mastoid air cells are clear. Normal appearance of the middle ear ossicles. Soft tissues: Soft tissue swelling in the left cheek and left side of the face. CT CERVICAL SPINE FINDINGS Alignment: Normal. Skull base  and vertebrae: No acute fracture. No primary bone lesion or focal pathologic process. Soft tissues and spinal canal: Swelling along the left lower face. No prevertebral fluid or swelling. No visible canal hematoma. Disc levels:  Disc space narrowing at C3-C4. Upper chest: Scarring at the lung apices. Negative for a large pneumothorax. Other: None IMPRESSION: No acute  intracranial abnormality. Cerebral atrophy and evidence for chronic small vessel ischemic changes. Soft tissue swelling along the left side of the face. No facial bone fracture. Mild paranasal sinus disease. No acute bone abnormality in the cervical spine. Electronically Signed   By: Markus Daft M.D.   On: 11/29/2016 14:07   Ct Maxillofacial Wo Contrast  Result Date: 11/29/2016 CLINICAL DATA:  Dementia.  Recent fall.  Left face swelling. EXAM: CT HEAD WITHOUT CONTRAST CT MAXILLOFACIAL WITHOUT CONTRAST CT CERVICAL SPINE WITHOUT CONTRAST TECHNIQUE: Multidetector CT imaging of the head, cervical spine, and maxillofacial structures were performed using the standard protocol without intravenous contrast. Multiplanar CT image reconstructions of the cervical spine and maxillofacial structures were also generated. 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 Finding is unchanged. Low-density in periventricular white matter appears stable. Negative for acute hemorrhage, mass lesion, midline shift, hydrocephalus or large infarct. Mild cerebral atrophy is unchanged. Vascular: No hyperdense vessel or unexpected calcification. Skull: Normal. Negative for fracture or focal lesion. Other: None. CT MAXILLOFACIAL FINDINGS Osseous: No fracture or mandibular dislocation. No destructive process. Orbits: Negative. No traumatic or inflammatory finding. Sinuses: Small polyp or retention cysts in the right maxillary sinus. Small amount of fluid or mucosal disease in the left sphenoid sinus. Mild disease in left ethmoid air cells. Mastoid air cells are clear. Normal appearance of the middle ear ossicles. Soft tissues: Soft tissue swelling in the left cheek and left side of the face. CT CERVICAL SPINE FINDINGS Alignment: Normal. Skull base and vertebrae: No acute fracture. No primary bone lesion or focal pathologic process.  Soft tissues and spinal canal: Swelling along the left lower face. No prevertebral fluid or swelling. No visible canal hematoma. Disc levels:  Disc space narrowing at C3-C4. Upper chest: Scarring at the lung apices. Negative for a large pneumothorax. Other: None IMPRESSION: No acute intracranial abnormality. Cerebral atrophy and evidence for chronic small vessel ischemic changes. Soft tissue swelling along the left side of the face. No facial bone fracture. Mild paranasal sinus disease. No acute bone abnormality in the cervical spine. Electronically Signed   By: Markus Daft M.D.   On: 11/29/2016 14:07    Procedures Procedures (including critical care time)  Medications Ordered in ED Medications  ondansetron (ZOFRAN-ODT) disintegrating tablet 8 mg (not administered)     Initial Impression / Assessment and Plan / ED Course  I have reviewed the triage vital signs and the nursing notes.  Pertinent labs & imaging results that were available during my care of the patient were reviewed by me and considered in my medical decision making (see chart for details).  Given recurrent emesis today, recent head contusion and limited history from patient, will repeat imaging study.   Reviewed nursing notes and prior charts for additional history.   zofran po.  Po fluids.  Ct neg a cute.   Tolerating po.  Patient appears stable for d/c.     Final Clinical Impressions(s) / ED Diagnoses   Final diagnoses:  None    New Prescriptions New Prescriptions   No medications  on file     Lajean Saver, MD 11/30/16 2119

## 2016-11-30 NOTE — Discharge Instructions (Signed)
It was our pleasure to provide your ER care today - we hope that you feel better.  Rest. Drink plenty of fluids.  You may give zofran as need for nausea.  Follow up with primary care doctor in the next few days if symptoms fail to improve/resolve.  Return to ER if worse, persistent vomiting, fevers, other concern.

## 2016-11-30 NOTE — ED Notes (Signed)
Pt's family stated that they preferred that they'd rather her not be put into a gown right now. Due to how difficult she is to dress.

## 2016-11-30 NOTE — ED Triage Notes (Signed)
Pt seen in ED yesterday for fall that happened 2 nights ago.  Bruising noted to left side of face and left eye.  Pt vomited x 3 today.  Husband wants pt re-assessed.

## 2017-07-03 ENCOUNTER — Other Ambulatory Visit: Payer: Self-pay

## 2017-07-03 ENCOUNTER — Emergency Department (HOSPITAL_COMMUNITY)
Admission: EM | Admit: 2017-07-03 | Discharge: 2017-07-04 | Disposition: A | Discharge status: Home / Self Care | Payer: BLUE CROSS/BLUE SHIELD | Attending: Physician Assistant | Admitting: Physician Assistant

## 2017-07-03 ENCOUNTER — Emergency Department (HOSPITAL_COMMUNITY): Payer: BLUE CROSS/BLUE SHIELD

## 2017-07-03 ENCOUNTER — Encounter (HOSPITAL_COMMUNITY): Payer: Self-pay | Admitting: *Deleted

## 2017-07-03 DIAGNOSIS — R4182 Altered mental status, unspecified: Secondary | ICD-10-CM | POA: Diagnosis present

## 2017-07-03 DIAGNOSIS — J690 Pneumonitis due to inhalation of food and vomit: Secondary | ICD-10-CM | POA: Insufficient documentation

## 2017-07-03 DIAGNOSIS — Z79899 Other long term (current) drug therapy: Secondary | ICD-10-CM | POA: Diagnosis not present

## 2017-07-03 DIAGNOSIS — F0390 Unspecified dementia without behavioral disturbance: Secondary | ICD-10-CM | POA: Insufficient documentation

## 2017-07-03 DIAGNOSIS — R1310 Dysphagia, unspecified: Secondary | ICD-10-CM | POA: Diagnosis not present

## 2017-07-03 HISTORY — DX: Malignant (primary) neoplasm, unspecified: C80.1

## 2017-07-03 HISTORY — DX: Malignant neoplasm of unspecified site of unspecified female breast: C50.919

## 2017-07-03 LAB — COMPREHENSIVE METABOLIC PANEL
ALT: 22 U/L (ref 14–54)
AST: 34 U/L (ref 15–41)
Albumin: 4.6 g/dL (ref 3.5–5.0)
Alkaline Phosphatase: 79 U/L (ref 38–126)
Anion gap: 14 (ref 5–15)
BUN: 19 mg/dL (ref 6–20)
CHLORIDE: 102 mmol/L (ref 101–111)
CO2: 21 mmol/L — ABNORMAL LOW (ref 22–32)
Calcium: 10 mg/dL (ref 8.9–10.3)
Creatinine, Ser: 0.72 mg/dL (ref 0.44–1.00)
GFR calc Af Amer: >60 mL/min (ref >60)
Glucose, Bld: 116 mg/dL — ABNORMAL HIGH (ref 65–99)
Potassium: 4.6 mmol/L (ref 3.5–5.1)
Sodium: 137 mmol/L (ref 135–145)
Total Bilirubin: 1 mg/dL (ref 0.3–1.2)
Total Protein: 8.2 g/dL — ABNORMAL HIGH (ref 6.5–8.1)

## 2017-07-03 LAB — CBC WITH DIFFERENTIAL/PLATELET
Basophils Absolute: 0 10*3/uL (ref 0.0–0.1)
Basophils Relative: 0 %
EOS PCT: 2 %
Eosinophils Absolute: 0.2 10*3/uL (ref 0.0–0.7)
HCT: 45.7 % (ref 36.0–46.0)
Hemoglobin: 15.6 g/dL — ABNORMAL HIGH (ref 12.0–15.0)
LYMPHS PCT: 12 %
Lymphs Abs: 1.2 10*3/uL (ref 0.7–4.0)
MCH: 31.5 pg (ref 26.0–34.0)
MCHC: 34.1 g/dL (ref 30.0–36.0)
MCV: 92.1 fL (ref 78.0–100.0)
Monocytes Absolute: 0.5 10*3/uL (ref 0.1–1.0)
Monocytes Relative: 5 %
Neutro Abs: 8.3 10*3/uL — ABNORMAL HIGH (ref 1.7–7.7)
Neutrophils Relative %: 81 %
PLATELETS: 241 10*3/uL (ref 150–400)
RBC: 4.96 MIL/uL (ref 3.87–5.11)
RDW: 13.6 % (ref 11.5–15.5)
WBC: 10.2 10*3/uL (ref 4.0–10.5)

## 2017-07-03 LAB — URINALYSIS, MICROSCOPIC (REFLEX)

## 2017-07-03 LAB — URINALYSIS, ROUTINE W REFLEX MICROSCOPIC
Bilirubin Urine: NEGATIVE
GLUCOSE, UA: NEGATIVE mg/dL
KETONES UR: NEGATIVE mg/dL
Nitrite: NEGATIVE
PROTEIN: 30 mg/dL — AB
Specific Gravity, Urine: >1.03 — ABNORMAL HIGH (ref 1.005–1.030)
pH: 5.5 (ref 5.0–8.0)

## 2017-07-03 LAB — I-STAT CG4 LACTIC ACID, ED: LACTIC ACID, VENOUS: 1.68 mmol/L (ref 0.5–1.9)

## 2017-07-03 MED ORDER — SODIUM CHLORIDE 0.9 % IV BOLUS (SEPSIS)
1000.0000 mL | Freq: Once | INTRAVENOUS | Status: AC
  Administered 2017-07-03: 1000 mL via INTRAVENOUS

## 2017-07-03 MED ORDER — CLINDAMYCIN HCL 150 MG PO CAPS
300.0000 mg | ORAL_CAPSULE | Freq: Once | ORAL | Status: AC
  Administered 2017-07-03: 300 mg via ORAL
  Filled 2017-07-03: qty 2

## 2017-07-03 MED ORDER — LEVOFLOXACIN 750 MG PO TABS: 750.0000 mg | ORAL_TABLET | Freq: Every day | ORAL | 0 refills | Status: DC

## 2017-07-03 MED ORDER — CLINDAMYCIN PALMITATE HCL 75 MG/5ML PO SOLR: 300.0000 mg | Freq: Three times a day (TID) | ORAL | Status: DC

## 2017-07-03 MED ORDER — SODIUM CHLORIDE 0.9 % IV BOLUS (SEPSIS): 1000.0000 mL | Freq: Once | INTRAVENOUS | Status: DC

## 2017-07-03 MED ORDER — CLINDAMYCIN PALMITATE HCL 75 MG/5ML PO SOLR: 300.0000 mg | Freq: Three times a day (TID) | ORAL | 0 refills | Status: AC

## 2017-07-03 MED ORDER — DIPHENHYDRAMINE HCL 50 MG/ML IJ SOLN: 25.0000 mg | Freq: Once | INTRAMUSCULAR | Status: DC

## 2017-07-03 MED ORDER — KETOROLAC TROMETHAMINE 15 MG/ML IJ SOLN: 15.0000 mg | Freq: Once | INTRAMUSCULAR | Status: DC

## 2017-07-03 MED ORDER — PROCHLORPERAZINE EDISYLATE 5 MG/ML IJ SOLN: 10.0000 mg | Freq: Once | INTRAMUSCULAR | Status: DC

## 2017-07-03 MED ORDER — DIPHENHYDRAMINE HCL 50 MG/ML IJ SOLN
25.0000 mg | Freq: Once | INTRAMUSCULAR | Status: DC
  Filled 2017-07-03: qty 1

## 2017-07-03 MED ORDER — LEVOFLOXACIN 750 MG PO TABS
750.0000 mg | ORAL_TABLET | Freq: Once | ORAL | Status: DC
  Filled 2017-07-03: qty 1

## 2017-07-03 NOTE — ED Provider Notes (Signed)
[facility] EMERGENCY DEPARTMENT Provider Note   CSN: [id number] Arrival date & time: 07/03/17  1620     History   Chief Complaint Chief Complaint  Patient presents with  . Altered Mental Status  . Weakness    HPI Gali Spinney is a 62 y.o. female.  HPI  Patient is a 62 year old female nonverbal dementia patient.  She is presenting with a couple of concerning episodes.  The patient's husband felt like she was breathing a little bit too heavily this morning.  An additional caregiver thought that she was acting a little bit different than usual shaking at the dinner table.  Patient had no fever.  Caregiver feels like her urine smelled different than usual.  And there is also concern for aspiration pneumonia because patient has difficulty with swallowing.  Patient otherwise had a fine appetite.     Past Medical History:  Diagnosis Date  . Breast cancer ([place])   . Cancer ([place])   . Dementia   . Dementia     There are no active problems to display for this patient.   History reviewed. No pertinent surgical history.  OB History    No data available       Home Medications    Prior to Admission medications   Medication Sig Start Date End Date Taking? Authorizing Provider  ondansetron (ZOFRAN ODT) 8 MG disintegrating tablet Take 1 tablet (8 mg total) by mouth every 8 (eight) hours as needed for nausea or vomiting. 11/30/16   Lajean Saver, MD  sertraline (ZOLOFT) 50 MG tablet Take 25-50 mg by mouth See admin instructions. Take 50 mg by mouth in the morning and take 25 mg by mouth in the evening 11/04/16   [provider]    Family History History reviewed. No pertinent family history.  Social History Social History   Tobacco Use  . Smoking status: Never Smoker  . Smokeless tobacco: Never Used  Substance Use Topics  . Alcohol use: Not on file  . Drug use: No     Allergies   Sulfa antibiotics and Clarithromycin   Review of  Systems Review of Systems  Unable to perform ROS: Patient nonverbal     Physical Exam Updated Vital Signs BP 106/70   Pulse 74   Temp 98.4 F (36.9 C) (Oral)   Resp (!) 21   SpO2 93%   Physical Exam  Constitutional: She appears well-developed and well-nourished.  Nonverbal 62 year old female with contracted upper limbs.  HENT:  Head: Normocephalic and atraumatic.  Eyes: Right eye exhibits no discharge. Left eye exhibits no discharge.  Cardiovascular: Normal rate, regular rhythm and normal heart sounds.  No murmur heard. Pulmonary/Chest: Effort normal and breath sounds normal. She has no wheezes. She has no rales.  Abdominal: Soft. She exhibits no distension. There is no tenderness.  Neurological:  Nonverbal at baseline.  Skin: Skin is warm and dry. She is not diaphoretic.  Nursing note and vitals reviewed.    ED Treatments / Results  Labs (all labs ordered are listed, but only abnormal results are displayed) Labs Reviewed  COMPREHENSIVE METABOLIC PANEL - Abnormal; Notable for the following components:      Result Value   CO2 21 (*)    Glucose, Bld 116 (*)    Total Protein 8.2 (*)    All other components within normal limits  CBC WITH DIFFERENTIAL/PLATELET - Abnormal; Notable for the following components:   Hemoglobin 15.6 (*)    Neutro Abs 8.3 (*)  All other components within normal limits  URINALYSIS, ROUTINE W REFLEX MICROSCOPIC  PROTIME-INR  I-STAT CG4 LACTIC ACID, ED    EKG  EKG Interpretation None       Radiology Dg Chest 2 View  Result Date: 07/03/2017 CLINICAL DATA:  Tremors and generalize weakness EXAM: CHEST  2 VIEW COMPARISON:  Report 07/23/2011 FINDINGS: Streaky pulmonary opacity anteriorly on the lateral view. No pleural effusion. Enlarged hila bilaterally. Normal heart size. No pneumothorax. IMPRESSION: 1. Streaky pulmonary opacity anteriorly on the lateral view, could reflect minimal infiltrate 2. Enlarged pulmonary hila bilaterally, not  certain if this is due to enlarged central pulmonary arteries or possible nodes. CT recommended for further evaluation. Electronically Signed   By: Donavan Foil M.D.   On: 07/03/2017 17:53    Procedures Procedures (including critical care time)  Medications Ordered in ED Medications  sodium chloride 0.9 % bolus 1,000 mL (not administered)  prochlorperazine (COMPAZINE) injection 10 mg (not administered)  diphenhydrAMINE (BENADRYL) injection 25 mg (not administered)  sodium chloride 0.9 % bolus 1,000 mL (not administered)  ketorolac (TORADOL) 15 MG/ML injection 15 mg (not administered)     Initial Impression / Assessment and Plan / ED Course  I have reviewed the triage vital signs and the nursing notes.  Pertinent labs & imaging results that were available during my care of the patient were reviewed by me and considered in my medical decision making (see chart for details).     Patient is a 62 year old female nonverbal dementia patient.  She is presenting with a couple of concerning episodes.  The patient's husband felt like she was breathing a little bit too heavily this morning.  An additional caregiver thought that she was acting a little bit different than usual shaking at the dinner table.  Patient had no fever.  Caregiver feels like her urine smelled different than usual.  And there is also concern for aspiration pneumonia because patient has difficulty with swallowing.  Patient otherwise had a fine appetite.    9:30 PM Patient well-appearing, normal vital signs.  Will do chest x-ray labs, urine.  Will give liter fluid  10:44 PM Straight cath with only enough urine for UA.  Will send.    Given soft call on x-ray, with concern for aspiration, will treat with antibiotics.  Patient currently take liquid antibiotics.  In addition she has clarithromycin and sulfa allergy.  Discussed with pharmacy.  Will treat with clindamycin.   Final Clinical Impressions(s) / ED Diagnoses    Final diagnoses:  None    ED Discharge Orders    None       [place], Fredia Sorrow, MD 07/09/17 1627

## 2017-07-03 NOTE — ED Notes (Signed)
ED Provider at bedside. 

## 2017-07-03 NOTE — ED Triage Notes (Signed)
No answer when called 

## 2017-07-03 NOTE — ED Notes (Signed)
Husband at bedside informed this RN that pt looked more flushed than normal. Pt axillary temp was 98.2. Husband reported he gave pt something to eat in the lobby and pt might be having a reaction. Husband requesting benadryl as it is was he gives her at home. Pt in no acute distress. Sandi Mariscal, MD notified.

## 2017-07-03 NOTE — ED Notes (Signed)
IV team at bedside 

## 2017-07-03 NOTE — ED Notes (Signed)
This RN offered to give benadryl per MD order but family refused. Husband reported he did not want her to become too drowsy. Medication held. MD notified.

## 2017-07-03 NOTE — ED Triage Notes (Signed)
Pt has dementia and is nonverbal. Per caregiver, pt having bilateral tremors and generalized weakness for several days. They report foul smelling urine, possible UTI and recent difficulty swallowing so possibly aspiration pneumonia. VSS and no acute distress is noted at triage.

## 2017-07-03 NOTE — ED Notes (Signed)
Per husband, around 4:30 pm pt was breathing heavy from having too many blankets on her. He took them off and he reported pt was sweaty. Caregiver got the pt to the table for breakfast and reported she was shaking. Pt reports they think she is dehydrated and has a UTI, per daughter who is a physician. Daughter also concerned about aspiration pneumonia because she chokes on food sometimes.

## 2017-07-03 NOTE — Discharge Instructions (Addendum)
We are giving Danielle Snyder an antibiotic for aspiration pneumonia because there was a small opacity seen on her x-ray.  Please return with any concerns.

## 2017-08-27 ENCOUNTER — Emergency Department (HOSPITAL_BASED_OUTPATIENT_CLINIC_OR_DEPARTMENT_OTHER)
Admission: EM | Admit: 2017-08-27 | Discharge: 2017-08-27 | Disposition: A | Discharge status: Home / Self Care | Payer: BLUE CROSS/BLUE SHIELD | Attending: Emergency Medicine | Admitting: Emergency Medicine

## 2017-08-27 ENCOUNTER — Emergency Department (HOSPITAL_BASED_OUTPATIENT_CLINIC_OR_DEPARTMENT_OTHER): Payer: BLUE CROSS/BLUE SHIELD

## 2017-08-27 ENCOUNTER — Encounter (HOSPITAL_BASED_OUTPATIENT_CLINIC_OR_DEPARTMENT_OTHER): Payer: Self-pay | Admitting: Emergency Medicine

## 2017-08-27 ENCOUNTER — Other Ambulatory Visit: Payer: Self-pay

## 2017-08-27 DIAGNOSIS — Z853 Personal history of malignant neoplasm of breast: Secondary | ICD-10-CM | POA: Insufficient documentation

## 2017-08-27 DIAGNOSIS — R1110 Vomiting, unspecified: Secondary | ICD-10-CM | POA: Diagnosis present

## 2017-08-27 DIAGNOSIS — J690 Pneumonitis due to inhalation of food and vomit: Secondary | ICD-10-CM

## 2017-08-27 DIAGNOSIS — F0390 Unspecified dementia without behavioral disturbance: Secondary | ICD-10-CM | POA: Insufficient documentation

## 2017-08-27 DIAGNOSIS — Z79899 Other long term (current) drug therapy: Secondary | ICD-10-CM | POA: Insufficient documentation

## 2017-08-27 LAB — CBC WITH DIFFERENTIAL/PLATELET
BASOS ABS: 0 10*3/uL (ref 0.0–0.1)
BASOS PCT: 0 %
EOS PCT: 0 %
Eosinophils Absolute: 0 10*3/uL (ref 0.0–0.7)
HEMATOCRIT: 42.3 % (ref 36.0–46.0)
Hemoglobin: 14.4 g/dL (ref 12.0–15.0)
Lymphocytes Relative: 3 %
Lymphs Abs: 0.4 10*3/uL — ABNORMAL LOW (ref 0.7–4.0)
MCH: 30.6 pg (ref 26.0–34.0)
MCHC: 34 g/dL (ref 30.0–36.0)
MCV: 89.8 fL (ref 78.0–100.0)
Monocytes Absolute: 0.4 10*3/uL (ref 0.1–1.0)
Monocytes Relative: 3 %
NEUTROS ABS: 13.1 10*3/uL — AB (ref 1.7–7.7)
Neutrophils Relative %: 94 %
PLATELETS: 213 10*3/uL (ref 150–400)
RBC: 4.71 MIL/uL (ref 3.87–5.11)
RDW: 13.5 % (ref 11.5–15.5)
WBC: 13.8 10*3/uL — ABNORMAL HIGH (ref 4.0–10.5)

## 2017-08-27 LAB — COMPREHENSIVE METABOLIC PANEL
ALBUMIN: 4.4 g/dL (ref 3.5–5.0)
ALK PHOS: 76 U/L (ref 38–126)
ALT: 34 U/L (ref 14–54)
ANION GAP: 9 (ref 5–15)
AST: 41 U/L (ref 15–41)
BILIRUBIN TOTAL: 1 mg/dL (ref 0.3–1.2)
BUN: 16 mg/dL (ref 6–20)
CALCIUM: 9.7 mg/dL (ref 8.9–10.3)
CO2: 25 mmol/L (ref 22–32)
Chloride: 99 mmol/L — ABNORMAL LOW (ref 101–111)
Creatinine, Ser: 0.81 mg/dL (ref 0.44–1.00)
GFR calc Af Amer: >60 mL/min (ref >60)
GFR calc non Af Amer: >60 mL/min (ref >60)
GLUCOSE: 169 mg/dL — AB (ref 65–99)
Potassium: 4.3 mmol/L (ref 3.5–5.1)
Sodium: 133 mmol/L — ABNORMAL LOW (ref 135–145)
TOTAL PROTEIN: 7.7 g/dL (ref 6.5–8.1)

## 2017-08-27 MED ORDER — LEVOFLOXACIN 25 MG/ML PO SOLN: 750.0000 mg | Freq: Every day | ORAL | 0 refills | Status: AC

## 2017-08-27 MED ORDER — LEVOFLOXACIN 750 MG PO TABS: 750.0000 mg | ORAL_TABLET | Freq: Every day | ORAL | 0 refills | Status: DC

## 2017-08-27 NOTE — Discharge Instructions (Signed)
Please read attached information regarding your condition. Follow-up with your primary care provider for further evaluation. Return to ED for worsening symptoms, trouble breathing or trouble swallowing, fevers, changes in mental status.

## 2017-08-27 NOTE — ED Triage Notes (Signed)
Per family:  possible aspiration on banana this am.  Pt has made gurgling sound afterwards.  NAD.  Lungs clear.

## 2017-08-27 NOTE — ED Provider Notes (Signed)
[facility] EMERGENCY DEPARTMENT Provider Note   CSN: [id number] Arrival date & time: 08/27/17  1824     History   Chief Complaint Chief Complaint  Patient presents with  . Other    possible aspiration    HPI Danielle Snyder is a 62 y.o. female who is nonverbal secondary to dementia, who presents to ED for possible aspiration.  Per husband, patient was at home with caregiver when she was eating a banana and began making gurgling noises and vomiting.  He did notice audible wheezing when he came home and examined her.  Reports history of pneumonia in the past which resolved with antibiotics.  They state that aside from the symptoms, patient has been otherwise acting at her baseline with no fevers.  HPI  Past Medical History:  Diagnosis Date  . Breast cancer ([place])   . Cancer ([place])   . Dementia   . Dementia     There are no active problems to display for this patient.   History reviewed. No pertinent surgical history.  OB History    No data available       Home Medications    Prior to Admission medications   Medication Sig Start Date End Date Taking? Authorizing Provider  sertraline (ZOLOFT) 50 MG tablet Take 25-50 mg by mouth See admin instructions. Take 50 mg by mouth in the morning and take 25 mg by mouth in the evening 11/04/16  Yes [provider]  levofloxacin (LEVAQUIN) 750 MG tablet Take 1 tablet (750 mg total) by mouth daily for 5 days. 08/27/17 09/01/17  Genna Casimir, Nicanor Alcon, PA-C  Multiple Vitamin (MULTIVITAMIN WITH MINERALS) TABS tablet Take 1 tablet by mouth daily.    [provider]    Family History No family history on file.  Social History Social History   Tobacco Use  . Smoking status: Never Smoker  . Smokeless tobacco: Never Used  Substance Use Topics  . Alcohol use: Not on file  . Drug use: No     Allergies   Sulfa antibiotics and Clarithromycin   Review of Systems Review of Systems  Respiratory: Positive for cough.    Gastrointestinal: Positive for vomiting.     Physical Exam Updated Vital Signs BP 126/70   Pulse 64   Temp 97.7 F (36.5 C)   Resp 16   Ht 5\' 7"  (1.702 m)   Wt 57.2 kg (126 lb)   SpO2 94%   BMI 19.73 kg/m   Physical Exam  Constitutional: She appears well-developed and well-nourished. No distress.  Nontoxic appearing and in no acute distress.  Upper extremity contractures noted which appear chronic.  HENT:  Head: Normocephalic and atraumatic.  Nose: Nose normal.  Eyes: Conjunctivae and EOM are normal. Right eye exhibits no discharge. Left eye exhibits no discharge. No scleral icterus.  Neck: Normal range of motion. Neck supple.  Cardiovascular: Normal rate, regular rhythm, normal heart sounds and intact distal pulses. Exam reveals no gallop and no friction rub.  No murmur heard. Pulmonary/Chest: Effort normal and breath sounds normal. No respiratory distress.  Abdominal: Soft. Bowel sounds are normal. She exhibits no distension. There is no tenderness. There is no guarding.  Musculoskeletal: Normal range of motion. She exhibits no edema.  Neurological: She is alert.  Skin: Skin is warm and dry. No rash noted.  Psychiatric: She has a normal mood and affect.  Nursing note and vitals reviewed.    ED Treatments / Results  Labs (all labs ordered are listed, but  only abnormal results are displayed) Labs Reviewed  CBC WITH DIFFERENTIAL/PLATELET - Abnormal; Notable for the following components:      Result Value   WBC 13.8 (*)    Neutro Abs 13.1 (*)    Lymphs Abs 0.4 (*)    All other components within normal limits  COMPREHENSIVE METABOLIC PANEL    EKG  EKG Interpretation None       Radiology Dg Chest 2 View  Result Date: 08/27/2017 CLINICAL DATA:  Aspiration. EXAM: CHEST - 2 VIEW COMPARISON:  None. FINDINGS: Lungs are hyperexpanded. There is patchy airspace disease at both bases. Biapical pleuroparenchymal scarring noted. Cardiopericardial silhouette is at upper  limits of normal for size. Small hiatal hernia evident. Bones diffusely demineralized. IMPRESSION: Patchy basilar airspace disease may be atelectatic although pneumonia could have this appearance. Electronically Signed   By: Misty Stanley M.D.   On: 08/27/2017 20:19    Procedures Procedures (including critical care time)  Medications Ordered in ED Medications - No data to display   Initial Impression / Assessment and Plan / ED Course  I have reviewed the triage vital signs and the nursing notes.  Pertinent labs & imaging results that were available during my care of the patient were reviewed by me and considered in my medical decision making (see chart for details).     Patient presents to ED for evaluation of possible aspiration earlier today.  Patient is nonverbal secondary to dementia at baseline.  Patient was eating a banana when she gargled and began vomiting.  Husband did hear audible wheezing when examining the patient.  On my examination she is overall well-appearing.  She is nonverbal.  There are upper extremity chronic contractures noted.  Her lungs are clear to auscultation bilaterally.  X-ray shows possible basilar airspace disease that could be atelectatic or pneumonia.  Due to patient's history, will treat for pneumonia.  Family members do not believe that patient needs to be admitted and prefer outpatient management with antibiotics as she had had pneumonia in the past which resolved with clindamycin 2 months ago.  Oxygen levels at 97% on room air.  Patient is afebrile with no history of fever.  She is not tachycardic or tachypneic and does not appear septic.  Will give patient Levaquin, advised to follow-up with PCP for further evaluation.  Patient appears stable for discharge at this time.  Strict return precautions given. Patient discussed with Dr. Sherry Ruffing.  Portions of this note were generated with [employer]. Dictation errors may occur despite best attempts at  proofreading.   Final Clinical Impressions(s) / ED Diagnoses   Final diagnoses:  Aspiration pneumonia, unspecified aspiration pneumonia type, unspecified laterality, unspecified part of lung [facility])    ED Discharge Orders        Ordered    levofloxacin (LEVAQUIN) 750 MG tablet  Daily     08/27/17 2253       Delia Heady, PA-C 08/27/17 2254    Tegeler, Gwenyth Allegra, MD 08/28/17 0001

## 2017-11-14 DEATH — deceased

## 2018-07-15 IMAGING — DX DG CHEST 2V
2 series · 2 of 2 positions shown · non-contrast
Comparison: None.

CLINICAL DATA: Aspiration.

EXAM:
CHEST - 2 VIEW

[chest lat]
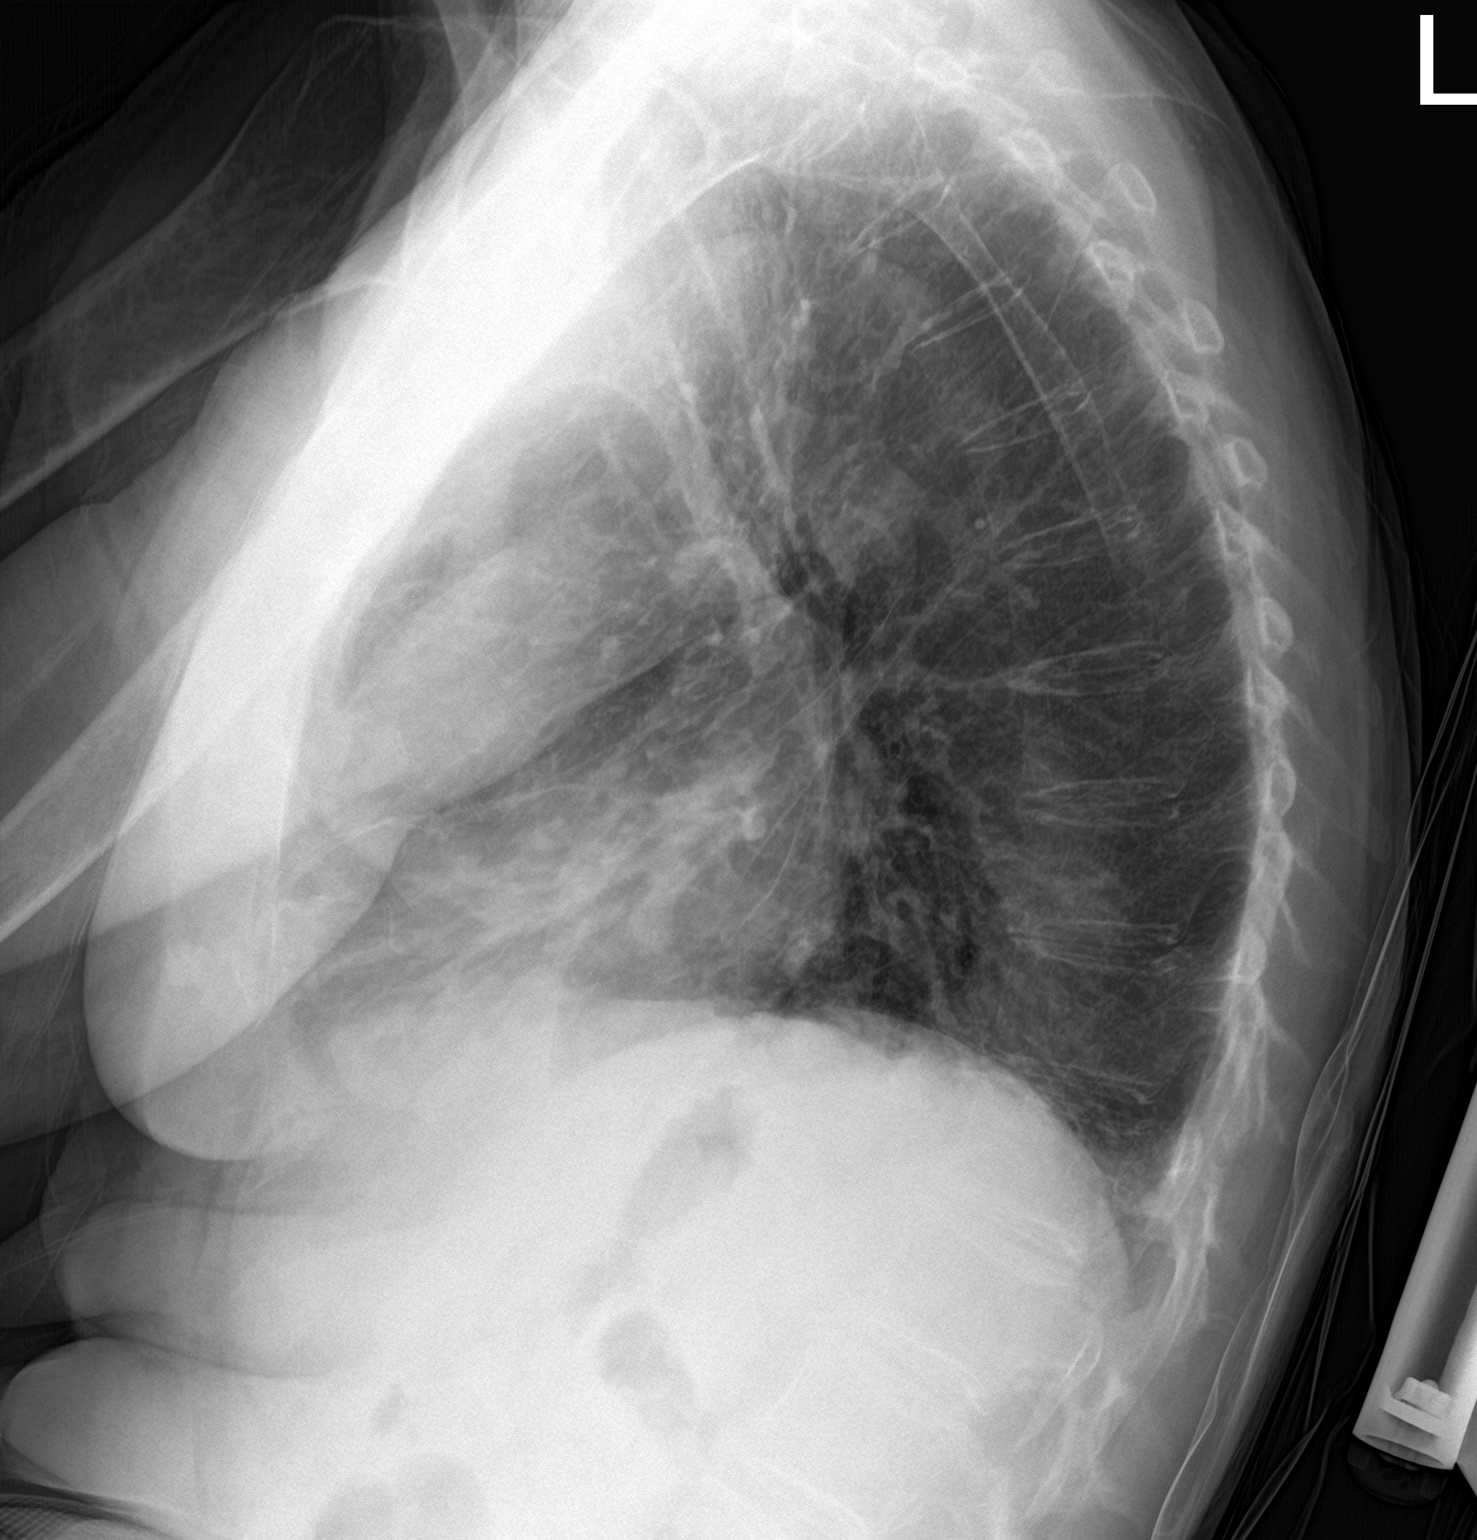

[chest ap strecther]
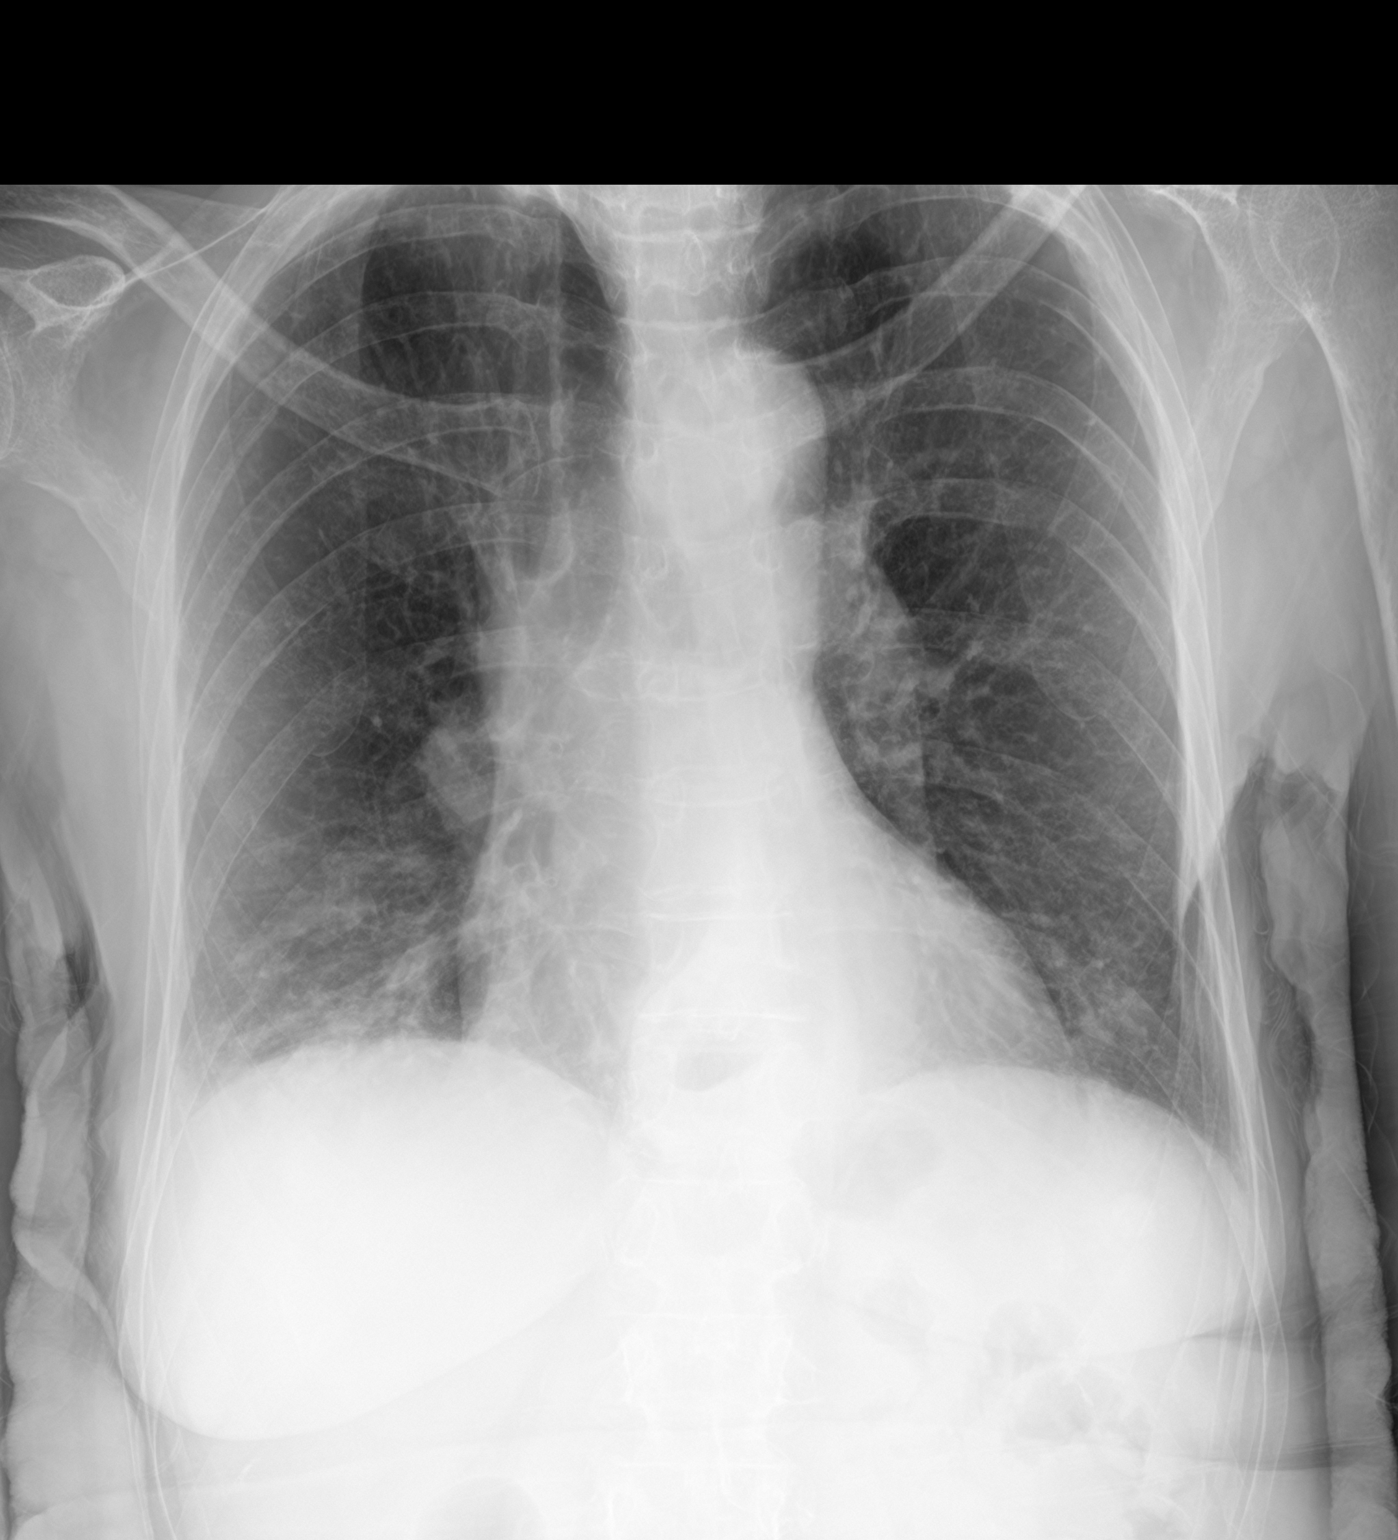

[2 of 2 positions shown; findings below may reference images not displayed]

FINDINGS: Lungs are hyperexpanded. There is patchy airspace disease at both
bases. Biapical pleuroparenchymal scarring noted. Cardiopericardial
silhouette is at upper limits of normal for size. Small hiatal
hernia evident. Bones diffusely demineralized.
IMPRESSION: Patchy basilar airspace disease may be atelectatic although
pneumonia could have this appearance.
